# Patient Record
Sex: Male | Born: 1980 | Race: White | Hispanic: No | Marital: Single | Smoking: Current every day smoker
Health system: Southern US, Community
[De-identification: ages and names within clinical notes are randomized; demographics above are authoritative.]

---

## 2007-12-24 ENCOUNTER — Emergency Department: Payer: Self-pay | Admitting: Emergency Medicine

## 2011-04-14 ENCOUNTER — Emergency Department: Payer: Self-pay | Admitting: Emergency Medicine

## 2012-06-13 ENCOUNTER — Emergency Department: Payer: Self-pay | Admitting: Emergency Medicine

## 2012-06-15 ENCOUNTER — Emergency Department: Payer: Self-pay | Admitting: Internal Medicine

## 2013-06-04 ENCOUNTER — Emergency Department: Payer: Self-pay | Admitting: Emergency Medicine

## 2013-11-21 ENCOUNTER — Emergency Department: Payer: Self-pay | Admitting: Emergency Medicine

## 2013-11-27 ENCOUNTER — Emergency Department (HOSPITAL_COMMUNITY): Payer: Self-pay

## 2013-11-27 ENCOUNTER — Encounter (HOSPITAL_COMMUNITY): Payer: Self-pay | Admitting: Emergency Medicine

## 2013-11-27 ENCOUNTER — Emergency Department (HOSPITAL_COMMUNITY)
Admission: EM | Admit: 2013-11-27 | Discharge: 2013-11-27 | Disposition: A | Discharge status: Home / Self Care | Payer: Self-pay | Attending: Emergency Medicine | Admitting: Emergency Medicine

## 2013-11-27 DIAGNOSIS — F17200 Nicotine dependence, unspecified, uncomplicated: Secondary | ICD-10-CM | POA: Insufficient documentation

## 2013-11-27 DIAGNOSIS — S81009A Unspecified open wound, unspecified knee, initial encounter: Secondary | ICD-10-CM | POA: Insufficient documentation

## 2013-11-27 DIAGNOSIS — Z23 Encounter for immunization: Secondary | ICD-10-CM | POA: Insufficient documentation

## 2013-11-27 DIAGNOSIS — S81809A Unspecified open wound, unspecified lower leg, initial encounter: Secondary | ICD-10-CM

## 2013-11-27 DIAGNOSIS — S61209A Unspecified open wound of unspecified finger without damage to nail, initial encounter: Secondary | ICD-10-CM | POA: Insufficient documentation

## 2013-11-27 DIAGNOSIS — T07XXXA Unspecified multiple injuries, initial encounter: Secondary | ICD-10-CM

## 2013-11-27 DIAGNOSIS — S0083XA Contusion of other part of head, initial encounter: Secondary | ICD-10-CM | POA: Insufficient documentation

## 2013-11-27 DIAGNOSIS — S01409A Unspecified open wound of unspecified cheek and temporomandibular area, initial encounter: Secondary | ICD-10-CM | POA: Insufficient documentation

## 2013-11-27 DIAGNOSIS — S91009A Unspecified open wound, unspecified ankle, initial encounter: Secondary | ICD-10-CM

## 2013-11-27 DIAGNOSIS — S1093XA Contusion of unspecified part of neck, initial encounter: Principal | ICD-10-CM

## 2013-11-27 DIAGNOSIS — S0003XA Contusion of scalp, initial encounter: Secondary | ICD-10-CM | POA: Insufficient documentation

## 2013-11-27 LAB — RAPID URINE DRUG SCREEN, HOSP PERFORMED
Amphetamines: NOT DETECTED
Barbiturates: NOT DETECTED
Benzodiazepines: NOT DETECTED
Cocaine: NOT DETECTED
OPIATES: NOT DETECTED
Tetrahydrocannabinol: POSITIVE — AB

## 2013-11-27 MED ORDER — CEPHALEXIN 500 MG PO CAPS: 500.0000 mg | ORAL_CAPSULE | Freq: Four times a day (QID) | ORAL | Status: AC

## 2013-11-27 MED ORDER — TETANUS-DIPHTH-ACELL PERTUSSIS 5-2.5-18.5 LF-MCG/0.5 IM SUSP
0.5000 mL | Freq: Once | INTRAMUSCULAR | Status: AC
  Administered 2013-11-27: 0.5 mL via INTRAMUSCULAR
  Filled 2013-11-27: qty 0.5

## 2013-11-27 MED ORDER — OXYCODONE-ACETAMINOPHEN 5-325 MG PO TABS
1.0000 | ORAL_TABLET | Freq: Once | ORAL | Status: AC
  Administered 2013-11-27: 1 via ORAL
  Filled 2013-11-27: qty 1

## 2013-11-27 MED ORDER — NICOTINE 21 MG/24HR TD PT24
21.0000 mg | MEDICATED_PATCH | Freq: Once | TRANSDERMAL | Status: DC
  Administered 2013-11-27: 21 mg via TRANSDERMAL
  Filled 2013-11-27: qty 1

## 2013-11-27 MED ORDER — OXYCODONE-ACETAMINOPHEN 5-325 MG PO TABS: 1.0000 | ORAL_TABLET | Freq: Four times a day (QID) | ORAL | Status: AC | PRN

## 2013-11-27 MED ORDER — SODIUM CHLORIDE 0.9 % IV BOLUS (SEPSIS)
1000.0000 mL | INTRAVENOUS | Status: AC
  Administered 2013-11-27: 1000 mL via INTRAVENOUS

## 2013-11-27 NOTE — Progress Notes (Signed)
Orthopedic Tech Progress Note Patient Details:  Corey Bowman 05/08/1981 [id number] [employer] Type of Ortho Device: Finger splint Ortho Device/Splint Location: Right 4th and 5th digits Ortho Device/Splint Interventions: Application   Asia R Thompson 11/27/2013, 8:20 AM
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
## 2013-11-27 NOTE — ED Notes (Signed)
Audiological scientistAlmanace Police officer at bedside.

## 2013-11-27 NOTE — ED Notes (Signed)
Pt. In handcuffs.
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
## 2013-12-15 ENCOUNTER — Ambulatory Visit: Payer: Self-pay | Admitting: Specialist

## 2014-09-11 NOTE — Op Note (Signed)
PATIENT NAME:  Corey SimmondsSIDES, Anibal MR#:  [id number] DATE OF BIRTH:  02/22/1981  DATE OF PROCEDURE:  12/15/2013  PREOPERATIVE DIAGNOSIS: Old laceration, flexor profundus tendon, right little finger.  POSTOPERATIVE DIAGNOSIS: Old laceration, flexor profundus tendon, right little finger.   PROCEDURE: Tenodesis distal interphalangeal joint, right little finger.   SURGEON: Myra Rudehristopher Emersen Mascari, M.D.   ANESTHESIA: General.   COMPLICATIONS: None.   DESCRIPTION OF PROCEDURE: Two grams of Ancef were given intravenously prior to induction of general anesthesia.  The right upper extremity is thoroughly prepped with alcohol and ChloraPrep and draped in standard sterile fashion. The extremity is wrapped out with the Esmarch bandage and pneumatic tourniquet elevated to 250 mmHg.  The previous laceration is transverse and is in the line of the DIP joint. This is first entered and then the incision is extended both proximally and distally under loupe magnification with careful preservation of the neurovascular bundles on each side.  The flexor tendon is seen to be cut almost directly off the insertion of the flexor profundus tendon at the base of the DIP joint. I attempted to retrieve the proximal portion of the flexor profundus tendon, extending the incision all the way down to the A1 pulley and the tendon could not be retrieved. The wound is thoroughly irrigated multiple times. It was decided at that point to do a tenodesis procedure of the DIP joint.  A 0.62 C-wire is then placed across the joint at an angle to keep the joint flexed approximately 30 degrees. Following this, local tissue was then used including the volar plate,  the vincula, and both divided arms of the flexor tendon sheath pulley to bridge the DIP joint with adequate tissue for  tenodesis.  Mersilene 4 - 0 sutures were used for the procedure. The wound is thoroughly irrigated multiple times. Skin edges are closed with 4-0 nylon.  Pin is cut and bent outside  the skin. The wound is thoroughly irrigated multiple times. A soft bulky dressing is applied with protection for the pin. The tourniquet is released and capillary refill returns immediately to the tip of the little finger. The patient is returned to the recovery room in satisfactory condition having tolerated the procedure quite well.   ____________________________ Clare Gandyhristopher E. Colbi Schiltz, MD ces:ts D: 12/17/2013 13:16:49 ET T: 12/17/2013 14:14:06 ET JOB#: [id number]  cc: Clare Gandyhristopher E. Harrison Zetina, MD, <Dictator> Clare GandyHRISTOPHER E Tryton Bodi MD ELECTRONICALLY SIGNED 12/23/2013 13:05
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
## 2014-11-19 DEATH — deceased
